# Patient Record
Sex: Male | Born: 1990 | Race: Black or African American | Hispanic: No
Health system: Southern US, Community
[De-identification: ages and names within clinical notes are randomized; demographics above are authoritative.]

---
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
## 2012-03-05 ENCOUNTER — Emergency Department (HOSPITAL_COMMUNITY): Payer: Self-pay

## 2012-03-05 ENCOUNTER — Encounter (HOSPITAL_COMMUNITY): Payer: Self-pay

## 2012-03-05 ENCOUNTER — Emergency Department (HOSPITAL_COMMUNITY)
Admission: EM | Admit: 2012-03-05 | Discharge: 2012-03-05 | Disposition: A | Discharge status: Home / Self Care | Payer: Self-pay | Attending: Emergency Medicine | Admitting: Emergency Medicine

## 2012-03-05 DIAGNOSIS — S20219A Contusion of unspecified front wall of thorax, initial encounter: Secondary | ICD-10-CM | POA: Insufficient documentation

## 2012-03-05 MED ORDER — HYDROCODONE-ACETAMINOPHEN 5-325 MG PO TABS: 2.0000 | ORAL_TABLET | ORAL | Status: DC | PRN
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
## 2013-10-10 ENCOUNTER — Emergency Department (HOSPITAL_COMMUNITY): Payer: Self-pay

## 2013-10-10 ENCOUNTER — Encounter (HOSPITAL_COMMUNITY): Payer: Self-pay | Admitting: Emergency Medicine

## 2013-10-10 ENCOUNTER — Emergency Department (HOSPITAL_COMMUNITY)
Admission: EM | Admit: 2013-10-10 | Discharge: 2013-10-10 | Disposition: A | Discharge status: Home / Self Care | Payer: Self-pay | Attending: Emergency Medicine | Admitting: Emergency Medicine

## 2013-10-10 DIAGNOSIS — Y9361 Activity, american tackle football: Secondary | ICD-10-CM | POA: Insufficient documentation

## 2013-10-10 DIAGNOSIS — S93402A Sprain of unspecified ligament of left ankle, initial encounter: Secondary | ICD-10-CM

## 2013-10-10 DIAGNOSIS — X500XXA Overexertion from strenuous movement or load, initial encounter: Secondary | ICD-10-CM | POA: Insufficient documentation

## 2013-10-10 DIAGNOSIS — Y92838 Other recreation area as the place of occurrence of the external cause: Secondary | ICD-10-CM

## 2013-10-10 DIAGNOSIS — S9000XA Contusion of unspecified ankle, initial encounter: Secondary | ICD-10-CM | POA: Insufficient documentation

## 2013-10-10 DIAGNOSIS — W219XXA Striking against or struck by unspecified sports equipment, initial encounter: Secondary | ICD-10-CM | POA: Insufficient documentation

## 2013-10-10 DIAGNOSIS — Y9239 Other specified sports and athletic area as the place of occurrence of the external cause: Secondary | ICD-10-CM | POA: Insufficient documentation

## 2013-10-10 DIAGNOSIS — S93409A Sprain of unspecified ligament of unspecified ankle, initial encounter: Secondary | ICD-10-CM | POA: Insufficient documentation

## 2013-10-10 MED ORDER — HYDROCODONE-ACETAMINOPHEN 5-325 MG PO TABS: 1.0000 | ORAL_TABLET | ORAL | Status: DC | PRN

## 2013-10-10 NOTE — Discharge Instructions (Signed)
Take the prescribed medication as directed.  Ice and elevate foot at home to help with pain and swelling. Follow-up with orthopedics, Dr. Roda ShuttersXu, if no improvement in 1 week or if symptoms worsen. Return to the ED for new or worsening symptoms.

## 2013-10-10 NOTE — ED Provider Notes (Signed)
Medical screening examination/treatment/procedure(s) were performed by non-physician practitioner and as supervising physician I was immediately available for consultation/collaboration.   EKG Interpretation None        Chandria Rookstool S Jemarcus Dougal, MD 10/10/13 1528 
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
## 2013-10-10 NOTE — ED Provider Notes (Signed)
CSN: [id number]     Arrival date & time 10/10/13  1316 History   [id number] chart was scribed for non-physician practitioner, Sharilyn SitesLisa Dafna Romo, working with Junius ArgyleForrest S Harrison, MD, by Tana ConchStephen Methvin ED Scribe. This patient was seen in WTR9/WTR9 and the patient's care was started at 2:07 PM.  Chief Complaint  Patient presents with  . Ankle Pain      The history is provided by the patient. No language interpreter was used.    HPI Comments: Seth Reeves is a 23 y.o. male who presents to the Emergency Department complaining of left ankle pain that began while he was playing football, he states that he "rolled his ankle after juking and then being hit during the game". No head trauma or LOC.  States he has had difficulty walking since injury.  No prior left ankle injuries or surgeries.  No intervention tried PTA.   No past medical history on file. No past surgical history on file. No family history on file. History  Substance Use Topics  . Smoking status: Never Smoker   . Smokeless tobacco: Not on file  . Alcohol Use: No    Review of Systems  Musculoskeletal: Positive for arthralgias and joint swelling.  All other systems reviewed and are negative.     Allergies  Review of patient's allergies indicates no known allergies.  Home Medications   Prior to Admission medications   Medication Sig Start Date End Date Taking? Authorizing Provider  HYDROcodone-acetaminophen (NORCO/VICODIN) 5-325 MG per tablet Take 2 tablets by mouth every 4 (four) hours as needed for pain. 03/05/12   Nelia Shiobert L Beaton, MD   BP 121/72  Pulse 66  Temp(Src) 98 F (36.7 C) (Oral)  Resp 16  SpO2 100% Physical Exam  Nursing note and vitals reviewed. Constitutional: He is oriented to person, place, and time. He appears well-developed and well-nourished. No distress.  HENT:  Head: Normocephalic and atraumatic.  Mouth/Throat: Oropharynx is clear and moist.  Eyes: Conjunctivae and EOM are normal. Pupils are equal,  round, and reactive to light.  Neck: Normal range of motion. Neck supple.  Cardiovascular: Normal rate, regular rhythm and normal heart sounds.   Pulmonary/Chest: Effort normal and breath sounds normal. No respiratory distress. He has no wheezes.  Musculoskeletal:       Left ankle: He exhibits decreased range of motion, swelling and ecchymosis. Tenderness. Lateral malleolus and posterior TFL tenderness found. Achilles tendon normal.  Left ankle with large amount of swelling, bruising, and tenderness to palpation surrounding lateral malleolus; limited ROM secondary to pain; strong DP pulse and cap refill; moving all toes appropriately; sensation intact diffusely throughout foot  Neurological: He is alert and oriented to person, place, and time.  Skin: Skin is warm and dry. He is not diaphoretic.  Psychiatric: He has a normal mood and affect.    ED Course  Procedures (including critical care time)  DIAGNOSTIC STUDIES: Oxygen Saturation is 100% on RA, normal by my interpretation.    COORDINATION OF CARE:   2:10 PM-Discussed treatment plan which includes crutches and a plan to get him walking, pain medication normally with pt at bedside and pt agreed to plan.   Labs Review Labs Reviewed - No data to display  Imaging Review Dg Ankle Complete Left  10/10/2013   CLINICAL DATA:  Pain around lateral malleolus after injury last night  EXAM: LEFT ANKLE COMPLETE - 3+ VIEW  COMPARISON:  None.  FINDINGS: No fracture or dislocation. Lateral soft tissue swelling. Small to  moderate joint effusion.  IMPRESSION: Sprain   Electronically Signed   By: Esperanza Heiraymond  Rubner M.D.   On: 10/10/2013 14:04     EKG Interpretation None      MDM   Final diagnoses:  Left ankle sprain   X-ray negative for acute fracture dislocation, likely sprain. Patient replaced in ASO ankle splint, crutches given.  Rx percocet.  Recommended RICE routine at home to help with pain and swelling.  FU with orthopedics if no  improvement in 1 week.  Discussed plan with patient, he/she acknowledged understanding and agreed with plan of care.  Return precautions given for new or worsening symptoms.  I personally performed the services described in this documentation, which was scribed in my presence. The recorded information has been reviewed and is accurate.    Seth HatchetLisa M Jaylenn Altier, PA-C 10/10/13 1515

## 2014-03-13 ENCOUNTER — Encounter (HOSPITAL_COMMUNITY): Payer: Self-pay | Admitting: Emergency Medicine

## 2014-03-13 ENCOUNTER — Emergency Department (HOSPITAL_COMMUNITY): Payer: No Typology Code available for payment source

## 2014-03-13 ENCOUNTER — Emergency Department (HOSPITAL_COMMUNITY)
Admission: EM | Admit: 2014-03-13 | Discharge: 2014-03-13 | Disposition: A | Discharge status: Home / Self Care | Payer: Self-pay | Attending: Emergency Medicine | Admitting: Emergency Medicine

## 2014-03-13 DIAGNOSIS — M25512 Pain in left shoulder: Secondary | ICD-10-CM

## 2014-03-13 DIAGNOSIS — Y9389 Activity, other specified: Secondary | ICD-10-CM | POA: Insufficient documentation

## 2014-03-13 DIAGNOSIS — Y92410 Unspecified street and highway as the place of occurrence of the external cause: Secondary | ICD-10-CM | POA: Insufficient documentation

## 2014-03-13 DIAGNOSIS — S4992XA Unspecified injury of left shoulder and upper arm, initial encounter: Secondary | ICD-10-CM | POA: Insufficient documentation

## 2014-03-13 DIAGNOSIS — Z720 Tobacco use: Secondary | ICD-10-CM | POA: Insufficient documentation

## 2014-03-13 DIAGNOSIS — S199XXA Unspecified injury of neck, initial encounter: Secondary | ICD-10-CM | POA: Insufficient documentation

## 2014-03-13 MED ORDER — HYDROCODONE-ACETAMINOPHEN 5-325 MG PO TABS
2.0000 | ORAL_TABLET | Freq: Once | ORAL | Status: AC
  Administered 2014-03-13: 2 via ORAL
  Filled 2014-03-13: qty 2

## 2014-03-13 MED ORDER — IBUPROFEN 800 MG PO TABS: 800.0000 mg | ORAL_TABLET | Freq: Three times a day (TID) | ORAL | Status: AC
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
## 2014-03-13 NOTE — ED Provider Notes (Signed)
Medical screening examination/treatment/procedure(s) were performed by non-physician practitioner and as supervising physician I was immediately available for consultation/collaboration.   EKG Interpretation None       Colston Pyle K Calli Bashor-Rasch, MD 03/13/14 0737 

## 2014-04-21 ENCOUNTER — Encounter (HOSPITAL_COMMUNITY): Payer: Self-pay | Admitting: Nurse Practitioner

## 2014-04-21 ENCOUNTER — Emergency Department (HOSPITAL_COMMUNITY)
Admission: EM | Admit: 2014-04-21 | Discharge: 2014-04-22 | Disposition: A | Discharge status: Home / Self Care | Payer: No Typology Code available for payment source | Attending: Emergency Medicine | Admitting: Emergency Medicine

## 2014-04-21 DIAGNOSIS — Y9289 Other specified places as the place of occurrence of the external cause: Secondary | ICD-10-CM | POA: Insufficient documentation

## 2014-04-21 DIAGNOSIS — Y990 Civilian activity done for income or pay: Secondary | ICD-10-CM | POA: Insufficient documentation

## 2014-04-21 DIAGNOSIS — S99922A Unspecified injury of left foot, initial encounter: Secondary | ICD-10-CM | POA: Insufficient documentation

## 2014-04-21 DIAGNOSIS — M79672 Pain in left foot: Secondary | ICD-10-CM

## 2014-04-21 DIAGNOSIS — Y9301 Activity, walking, marching and hiking: Secondary | ICD-10-CM | POA: Insufficient documentation

## 2014-04-21 DIAGNOSIS — Z791 Long term (current) use of non-steroidal anti-inflammatories (NSAID): Secondary | ICD-10-CM | POA: Insufficient documentation

## 2014-04-21 DIAGNOSIS — Z720 Tobacco use: Secondary | ICD-10-CM | POA: Insufficient documentation

## 2014-04-21 DIAGNOSIS — X58XXXA Exposure to other specified factors, initial encounter: Secondary | ICD-10-CM | POA: Insufficient documentation

## 2014-04-21 DIAGNOSIS — S99912A Unspecified injury of left ankle, initial encounter: Secondary | ICD-10-CM | POA: Insufficient documentation

## 2014-04-21 NOTE — ED Provider Notes (Signed)
CSN: [id number]     Arrival date & time 04/21/14  2241 History  This chart was scribed for non-physician practitioner, Seth MaduraKelly Rajiv Parlato, Seth Reeves working with Lyanne CoKevin M Campos, MD by Luisa DagoPriscilla Tutu, ED scribe. This patient was seen in room WTR8/WTR8 and the patient's care was started at 11:57 PM.    Chief Complaint  Patient presents with  . Ankle Injury    Left   The history is provided by the patient. No language interpreter was used.   HPI Comments: Seth Reeves is a 23 y.o. male who presents to the Emergency Department complaining of sudden onset worsening left ankle pain that occurred PTA. Pt states that he was walking at work when his left ankle "gave out on him". Following that he felt a "sharp/shooting" pain to the affected ankle. Pt has a hx of severe sprain to the left ankle which happened while playing basketball. Seth Reeves states that the pain is exacerbated by bearing weight. He denies taking any OTC medication to alleviate the pain. Denies any loss of sensation, hx of bone disorders, fever, or chills.  History reviewed. No pertinent past medical history. History reviewed. No pertinent past surgical history. History reviewed. No pertinent family history. History  Substance Use Topics  . Smoking status: Current Every Day Smoker  . Smokeless tobacco: Not on file  . Alcohol Use: No    Review of Systems  Constitutional: Negative for fever and chills.  Musculoskeletal: Positive for arthralgias.  All other systems reviewed and are negative.   Allergies  Review of patient's allergies indicates no known allergies.  Home Medications   Prior to Admission medications   Medication Sig Start Date End Date Taking? Authorizing Provider  ibuprofen (ADVIL,MOTRIN) 800 MG tablet Take 1 tablet (800 mg total) by mouth 3 (three) times daily. 03/13/14   Kathrynn Speedobyn M Hess, Seth Reeves  meloxicam (MOBIC) 7.5 MG tablet Take 2 tablets (15 mg total) by mouth daily. 04/22/14   Seth MaduraKelly Delvon Chipps, Seth Reeves   Triage  vitals:BP 116/72 mmHg  Pulse 86  Temp(Src) 98.6 F (37 C) (Oral)  Resp 14  SpO2 100%  Physical Exam  Constitutional: He is oriented to person, place, and time. He appears well-developed and well-nourished. No distress.  Nontoxic/nonseptic appearing  HENT:  Head: Normocephalic and atraumatic.  Eyes: Conjunctivae and EOM are normal. No scleral icterus.  Neck: Normal range of motion.  Cardiovascular: Normal rate, regular rhythm and intact distal pulses.   DP and PT pulses 2+ bilaterally.  Pulmonary/Chest: Effort normal. No respiratory distress.  Musculoskeletal: Normal range of motion. He exhibits tenderness.       Left ankle: He exhibits normal range of motion, no swelling, no deformity and normal pulse. Tenderness. Medial malleolus (inferior and anterior to medial malleolus) tenderness found. Achilles tendon normal.       Left foot: There is tenderness. There is no bony tenderness.       Feet:  Tenderness to palpation appreciated along the medial arch of the left foot as well as to the medial plantar fascia and plantar aspect of the left heel. TTP to inferior and anterior aspect of medial malleolus. Patient has no bony tenderness, swelling, or deformity. No erythema or heat to touch.  Neurological: He is alert and oriented to person, place, and time. He exhibits normal muscle tone. Coordination normal.  Sensation to light touch intact.  Skin: Skin is warm and dry. No rash noted. He is not diaphoretic. No erythema. No pallor.  Psychiatric: He has a normal mood  and affect. His behavior is normal.  Nursing note and vitals reviewed.   ED Course  Procedures (including critical care time)  DIAGNOSTIC STUDIES: Oxygen Saturation is 100% on RA, normal by my interpretation.    COORDINATION OF CARE: 12:04 AM- Advised pt to rest, ice, and elevate his left ankle. Pt advised of plan for treatment and pt agrees.  Labs Review Labs Reviewed - No data to display  Imaging Review No results  found.   EKG Interpretation None      MDM   Final diagnoses:  Left foot pain    23 year old male presents to the emergency department for further evaluation of left foot/ankle pain. He is neurovascularly intact and without focal bony tenderness. No crepitus or deformity. Do not believe further work up or imaging is indicated at the present time. Findings suspected to be muscular/tendon related. Question possible plantar fasciitis as well as patient states that he is quite active and runs a lot. We will manage with RICE, ASO ankle, and NSAIDs. Crutches provided for weightbearing as tolerated. Return precautions discussed and provided. Patient agreeable to plan with known address concerns. Patient discharged in good condition.  I personally performed the services described in this documentation, which was scribed in my presence. The recorded information has been reviewed and is accurate.   Filed Vitals:   04/21/14 2353  BP: 116/72  Pulse: 86  Temp: 98.6 F (37 C)  TempSrc: Oral  Resp: 14  SpO2: 100%     Seth MaduraKelly Zebulin Siegel, Seth Reeves 04/26/14 [id number]  Lyanne CoKevin M Campos, MD 04/29/14 [phone]

## 2014-04-21 NOTE — ED Notes (Signed)
Pt report ongoing pain with his left ankle secondary to an injury he sustained 4 months ago, states he sprained it today while he was walking, describes as sharp pain. No swelling or deformity noted at this time.

## 2014-04-22 MED ORDER — IBUPROFEN 800 MG PO TABS
800.0000 mg | ORAL_TABLET | Freq: Once | ORAL | Status: AC
  Administered 2014-04-22: 800 mg via ORAL
  Filled 2014-04-22: qty 1

## 2014-04-22 MED ORDER — MELOXICAM 7.5 MG PO TABS: 15.0000 mg | ORAL_TABLET | Freq: Every day | ORAL | Status: AC

## 2014-04-22 MED ORDER — HYDROCODONE-ACETAMINOPHEN 5-325 MG PO TABS
2.0000 | ORAL_TABLET | Freq: Once | ORAL | Status: DC
  Filled 2014-04-22: qty 2

## 2014-04-22 NOTE — Discharge Instructions (Signed)
Plantar Fasciitis °Plantar fasciitis is a common condition that causes foot pain. It is soreness (inflammation) of the band of tough fibrous tissue on the bottom of the foot that runs from the heel bone (calcaneus) to the ball of the foot. The cause of this soreness may be from excessive standing, poor fitting shoes, running on hard surfaces, being overweight, having an abnormal walk, or overuse (this is common in runners) of the painful foot or feet. It is also common in aerobic exercise dancers and ballet dancers. °SYMPTOMS  °Most people with plantar fasciitis complain of: °· Severe pain in the morning on the bottom of their foot especially when taking the first steps out of bed. This pain recedes after a few minutes of walking. °· Severe pain is experienced also during walking following a long period of inactivity. °· Pain is worse when walking barefoot or up stairs °DIAGNOSIS  °· Your caregiver will diagnose this condition by examining and feeling your foot. °· Special tests such as X-rays of your foot, are usually not needed. °PREVENTION  °· Consult a sports medicine professional before beginning a new exercise program. °· Walking programs offer a good workout. With walking there is a lower chance of overuse injuries common to runners. There is less impact and less jarring of the joints. °· Begin all new exercise programs slowly. If problems or pain develop, decrease the amount of time or distance until you are at a comfortable level. °· Wear good shoes and replace them regularly. °· Stretch your foot and the heel cords at the back of the ankle (Achilles tendon) both before and after exercise. °· Run or exercise on even surfaces that are not hard. For example, asphalt is better than pavement. °· Do not run barefoot on hard surfaces. °· If using a treadmill, vary the incline. °· Do not continue to workout if you have foot or joint problems. Seek professional help if they do not improve. °HOME CARE INSTRUCTIONS    °· Avoid activities that cause you pain until you recover. °· Use ice or cold packs on the problem or painful areas after working out. °· Only take over-the-counter or prescription medicines for pain, discomfort, or fever as directed by your caregiver. °· Soft shoe inserts or athletic shoes with air or gel sole cushions may be helpful. °· If problems continue or become more severe, consult a sports medicine caregiver or your own health care provider. Cortisone is a potent anti-inflammatory medication that may be injected into the painful area. You can discuss this treatment with your caregiver. °MAKE SURE YOU:  °· Understand these instructions. °· Will watch your condition. °· Will get help right away if you are not doing well or get worse. °Document Released: 02/13/2001 Document Revised: 08/13/2011 Document Reviewed: 04/14/2008 °ExitCare® Patient Information ©2015 [employer]. This information is not intended to replace advice given to you by your health care provider. Make sure you discuss any questions you have with your health care provider. ° °RICE: Routine Care for Injuries °The routine care of many injuries includes Rest, Ice, Compression, and Elevation (RICE). °HOME CARE INSTRUCTIONS °· Rest is needed to allow your body to heal. Routine activities can usually be resumed when comfortable. Injured tendons and bones can take up to 6 weeks to heal. Tendons are the cord-like structures that attach muscle to bone. °· Ice following an injury helps keep the swelling down and reduces pain. °¨ Put ice in a plastic bag. °¨ Place a towel between your skin   and the bag. °¨ Leave the ice on for 15-20 minutes, 3-4 times a day, or as directed by your health care provider. Do this while awake, for the first 24 to 48 hours. After that, continue as directed by your caregiver. °· Compression helps keep swelling down. It also gives support and helps with discomfort. If an elastic bandage has been applied, it should be removed  and reapplied every 3 to 4 hours. It should not be applied tightly, but firmly enough to keep swelling down. Watch fingers or toes for swelling, bluish discoloration, coldness, numbness, or excessive pain. If any of these problems occur, remove the bandage and reapply loosely. Contact your caregiver if these problems continue. °· Elevation helps reduce swelling and decreases pain. With extremities, such as the arms, hands, legs, and feet, the injured area should be placed near or above the level of the heart, if possible. °SEEK IMMEDIATE MEDICAL CARE IF: °· You have persistent pain and swelling. °· You develop redness, numbness, or unexpected weakness. °· Your symptoms are getting worse rather than improving after several days. °These symptoms may indicate that further evaluation or further X-rays are needed. Sometimes, X-rays may not show a small broken bone (fracture) until 1 week or 10 days later. Make a follow-up appointment with your caregiver. Ask when your X-ray results will be ready. Make sure you get your X-ray results. °Document Released: 09/02/2000 Document Revised: 05/26/2013 Document Reviewed: 10/20/2010 °ExitCare® Patient Information ©2015 [employer]. This information is not intended to replace advice given to you by your health care provider. Make sure you discuss any questions you have with your health care provider. ° °

## 2014-08-13 ENCOUNTER — Encounter (HOSPITAL_COMMUNITY): Payer: Self-pay | Admitting: Emergency Medicine

## 2014-08-13 ENCOUNTER — Emergency Department (HOSPITAL_COMMUNITY)
Admission: EM | Admit: 2014-08-13 | Discharge: 2014-08-13 | Disposition: A | Discharge status: Home / Self Care | Payer: Self-pay | Attending: Emergency Medicine | Admitting: Emergency Medicine

## 2014-08-13 DIAGNOSIS — Z202 Contact with and (suspected) exposure to infections with a predominantly sexual mode of transmission: Secondary | ICD-10-CM | POA: Insufficient documentation

## 2014-08-13 DIAGNOSIS — Z720 Tobacco use: Secondary | ICD-10-CM | POA: Insufficient documentation

## 2014-08-13 DIAGNOSIS — Z791 Long term (current) use of non-steroidal anti-inflammatories (NSAID): Secondary | ICD-10-CM | POA: Insufficient documentation

## 2014-08-13 MED ORDER — ONDANSETRON 4 MG PO TBDP
4.0000 mg | ORAL_TABLET | Freq: Once | ORAL | Status: AC
  Administered 2014-08-13: 4 mg via ORAL
  Filled 2014-08-13: qty 1

## 2014-08-13 MED ORDER — CEFTRIAXONE SODIUM 250 MG IJ SOLR
250.0000 mg | Freq: Once | INTRAMUSCULAR | Status: AC
  Administered 2014-08-13: 250 mg via INTRAMUSCULAR
  Filled 2014-08-13: qty 250

## 2014-08-13 MED ORDER — LIDOCAINE HCL 1 % IJ SOLN
INTRAMUSCULAR | Status: AC
  Administered 2014-08-13: 20 mL
  Filled 2014-08-13: qty 20

## 2014-08-13 MED ORDER — AZITHROMYCIN 250 MG PO TABS
1000.0000 mg | ORAL_TABLET | Freq: Once | ORAL | Status: AC
  Administered 2014-08-13: 1000 mg via ORAL
  Filled 2014-08-13: qty 4

## 2014-08-13 NOTE — ED Notes (Signed)
Pt states he has been exposed to chlamydia and wants to get a STD check,.

## 2014-08-13 NOTE — ED Provider Notes (Signed)
'[p0;/CSN: [id number]     Arrival date & time 08/13/14  1107 History  This chart was scribed for Arthor Captain, PA-C working with No att. providers found by Elveria Rising, ED Scribe. This patient was seen in room WTR7/WTR7 and the patient's care was started at 12:59 PM.   Chief Complaint  Patient presents with  . SEXUALLY TRANSMITTED DISEASE   The history is provided by the patient. No language interpreter was used.   HPI Comments: Seth Reeves is a 24 y.o. male who presents to the Emergency Department reporting exposure to Chlamydia. Patient states that his girlfriend has Chlamyida and they've had unprotected sex. Patient reports that they've not had sex between her being diagnosed last week and her treatment. Patient denies additional sexual partners. Patient reports that he is asymptomatic and specifically denies testicular pain.   History reviewed. No pertinent past medical history. History reviewed. No pertinent past surgical history. No family history on file. History  Substance Use Topics  . Smoking status: Current Every Day Smoker  . Smokeless tobacco: Not on file  . Alcohol Use: No    Review of Systems  Constitutional: Negative for fever and chills.  Genitourinary: Positive for dysuria. Negative for genital sores and testicular pain.  All other systems reviewed and are negative.   Allergies  Review of patient's allergies indicates no known allergies.  Home Medications   Prior to Admission medications   Medication Sig Start Date End Date Taking? Authorizing Provider  ibuprofen (ADVIL,MOTRIN) 800 MG tablet Take 1 tablet (800 mg total) by mouth 3 (three) times daily. Patient not taking: Reported on 08/13/2014 03/13/14   Kathrynn Speed, PA-C  meloxicam (MOBIC) 7.5 MG tablet Take 2 tablets (15 mg total) by mouth daily. Patient not taking: Reported on 08/13/2014 04/22/14   Antony Madura, PA-C   BP 121/58 mmHg  Pulse 56  Temp(Src) 98.3 F (36.8 C) (Oral)  Resp 20  SpO2  100% Physical Exam  Constitutional: He is oriented to person, place, and time. He appears well-developed and well-nourished. No distress.  HENT:  Head: Normocephalic and atraumatic.  Eyes: Conjunctivae and EOM are normal. No scleral icterus.  Neck: Normal range of motion. Neck supple. No tracheal deviation present.  Cardiovascular: Normal rate, regular rhythm and normal heart sounds.   Pulmonary/Chest: Effort normal and breath sounds normal. No respiratory distress.  Abdominal: Soft. There is no tenderness.  Genitourinary: Penis normal. Circumcised.  Normal male anatomy. No discharge noted. No lesions. No testicular pain.   Musculoskeletal: Normal range of motion. He exhibits no edema.  Neurological: He is alert and oriented to person, place, and time.  Skin: Skin is warm and dry. He is not diaphoretic.  Psychiatric: He has a normal mood and affect. His behavior is normal.  Nursing note and vitals reviewed.   ED Course  Procedures (including critical care time)  COORDINATION OF CARE: 1:04 PM- Discussed treatment plan with patient at bedside and patient agreed to plan.   Labs Review Labs Reviewed  GC/CHLAMYDIA PROBE AMP ([place]) - Abnormal; Notable for the following:    Chlamydia **CT: POSITIVE** (*)    All other components within normal limits  HIV ANTIBODY (ROUTINE TESTING)  RPR    Imaging Review No results found.   EKG Interpretation None      MDM   Final diagnoses:  Exposure to STD    Patient treated for GC/Chlamydia. RPR/HIV sent. Discussed safe sex practices. Asymptomatic. No repeat exposure.  I personally performed the services  described in this documentation, which was scribed in my presence. The recorded information has been reviewed and is accurate.      Arthor Captainbigail Dmitriy Gair, PA-C 08/17/14 1630  Derwood KaplanAnkit Nanavati, MD 08/19/14 1212

## 2014-08-13 NOTE — Discharge Instructions (Signed)

## 2014-08-14 LAB — RPR: RPR Ser Ql: NONREACTIVE

## 2014-08-14 LAB — HIV ANTIBODY (ROUTINE TESTING W REFLEX): HIV SCREEN 4TH GENERATION: NONREACTIVE

## 2014-08-16 LAB — GC/CHLAMYDIA PROBE AMP (~~LOC~~) NOT AT ARMC
Chlamydia: POSITIVE — AB
NEISSERIA GONORRHEA: NEGATIVE

## 2014-08-19 ENCOUNTER — Telehealth (HOSPITAL_BASED_OUTPATIENT_CLINIC_OR_DEPARTMENT_OTHER): Payer: Self-pay | Admitting: Emergency Medicine

## 2014-08-19 NOTE — Telephone Encounter (Signed)
Post ED Visit - Positive Culture Follow-up  Culture report reviewed by antimicrobial stewardship pharmacist: []  Wes Dulaney, Pharm.D., BCPS []  Celedonio MiyamotoJeremy Frens, Pharm.D., BCPS []  Georgina PillionElizabeth Martin, Pharm.D., BCPS []  [place] Pham, [address]Ph[address]D., BCPS, AAHIVP []  Estella HuskMichelle Turner, Pharm.D., BCPS, AAHIVP []  Elder CyphersLorie Poole, [address]Pharm[address]D., BCPS  Positive chlamydia culture, notified and educated re abstinence and notification of sexual partner(s)  Berle MullMiller, Rhyann Berton 08/19/2014, 12:23 PM
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
## 2017-08-25 ENCOUNTER — Emergency Department (HOSPITAL_COMMUNITY): Admission: EM | Admit: 2017-08-25 | Discharge: 2017-08-25 | Discharge status: Against Medical Advice | Payer: Self-pay

## 2019-08-15 ENCOUNTER — Ambulatory Visit: Payer: Medicaid Other | Attending: Internal Medicine

## 2019-08-15 ENCOUNTER — Other Ambulatory Visit: Payer: Self-pay

## 2019-08-15 DIAGNOSIS — Z23 Encounter for immunization: Secondary | ICD-10-CM

## 2019-08-15 NOTE — Progress Notes (Signed)
   Covid-19 Vaccination Clinic  Name:  Seth Reeves    MRN: [id number] DOB: Sep 15, 1990  08/15/2019  Seth Reeves was observed post Covid-19 immunization for 15 minutes without incident. He was provided with Vaccine Information Sheet and instruction to access the V-Safe system.   Seth Reeves was instructed to call 911 with any severe reactions post vaccine: Marland Kitchen Difficulty breathing  . Swelling of face and throat  . A fast heartbeat  . A bad rash all over body  . Dizziness and weakness   Immunizations Administered    Name Date Dose VIS Date Route   Pfizer COVID-19 Vaccine 08/15/2019 12:47 PM 0.3 mL 05/15/2019 Intramuscular   Manufacturer: [employer], [employer]   Lot: [id number]   NDC: [address]-4

## 2019-09-09 ENCOUNTER — Ambulatory Visit: Payer: Medicaid Other | Attending: Internal Medicine

## 2019-09-09 DIAGNOSIS — Z23 Encounter for immunization: Secondary | ICD-10-CM

## 2019-09-09 NOTE — Progress Notes (Signed)
   Covid-19 Vaccination Clinic  Name:  ZYREE TRAYNHAM    MRN: [id number] DOB: 1990/08/30  09/09/2019  Mr. Purohit was observed post Covid-19 immunization for 15 minutes without incident. He was provided with Vaccine Information Sheet and instruction to access the V-Safe system.   Mr. Stapel was instructed to call 911 with any severe reactions post vaccine: Marland Kitchen Difficulty breathing  . Swelling of face and throat  . A fast heartbeat  . A bad rash all over body  . Dizziness and weakness   Immunizations Administered    Name Date Dose VIS Date Route   Pfizer COVID-19 Vaccine 09/09/2019  2:54 PM 0.3 mL 05/15/2019 Intramuscular   Manufacturer: [employer], [employer]   Lot: [id number]   NDC: [address]-9
